# Patient Record
Sex: Male | Born: 1990 | Race: White | Hispanic: No | Marital: Single | Smoking: Never smoker
Health system: Southern US, Community
[De-identification: ages and names within clinical notes are randomized; demographics above are authoritative.]

## PROBLEM LIST (undated history)

## (undated) DIAGNOSIS — E7800 Pure hypercholesterolemia, unspecified: Secondary | ICD-10-CM

---
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
## 2020-05-22 DIAGNOSIS — L821 Other seborrheic keratosis: Secondary | ICD-10-CM | POA: Diagnosis not present

## 2020-05-22 DIAGNOSIS — L814 Other melanin hyperpigmentation: Secondary | ICD-10-CM | POA: Diagnosis not present

## 2020-05-22 DIAGNOSIS — L819 Disorder of pigmentation, unspecified: Secondary | ICD-10-CM | POA: Diagnosis not present

## 2020-05-22 DIAGNOSIS — D229 Melanocytic nevi, unspecified: Secondary | ICD-10-CM | POA: Diagnosis not present

## 2020-05-22 DIAGNOSIS — L570 Actinic keratosis: Secondary | ICD-10-CM | POA: Diagnosis not present

## 2020-07-17 ENCOUNTER — Ambulatory Visit (INDEPENDENT_AMBULATORY_CARE_PROVIDER_SITE_OTHER): Payer: BC Managed Care – PPO

## 2020-07-17 ENCOUNTER — Encounter (HOSPITAL_COMMUNITY): Payer: Self-pay

## 2020-07-17 ENCOUNTER — Ambulatory Visit (HOSPITAL_COMMUNITY)
Admission: RE | Admit: 2020-07-17 | Discharge: 2020-07-17 | Disposition: A | Discharge status: Home / Self Care | Payer: BC Managed Care – PPO | Source: Ambulatory Visit | Attending: Urgent Care | Admitting: Urgent Care

## 2020-07-17 ENCOUNTER — Other Ambulatory Visit: Payer: Self-pay

## 2020-07-17 VITALS — BP 144/87 | HR 60 | Temp 98.2°F | Resp 18

## 2020-07-17 DIAGNOSIS — R200 Anesthesia of skin: Secondary | ICD-10-CM

## 2020-07-17 DIAGNOSIS — R202 Paresthesia of skin: Secondary | ICD-10-CM | POA: Diagnosis not present

## 2020-07-17 DIAGNOSIS — M79601 Pain in right arm: Secondary | ICD-10-CM

## 2020-07-17 DIAGNOSIS — R222 Localized swelling, mass and lump, trunk: Secondary | ICD-10-CM | POA: Diagnosis not present

## 2020-07-17 HISTORY — DX: Pure hypercholesterolemia, unspecified: E78.00

## 2020-07-17 NOTE — ED Provider Notes (Signed)
[facility]   MRN: [id number] DOB: Jun 06, 1991  Subjective:   Jake Wolfe is a 29 y.o. male presenting for acute onset this morning right arm tingling and numbness from the mid bicep all the way down to his fingers.  Denies fall, trauma, neck pain, chest pain, shortness of breath.  Patient has a history of hyperlipidemia but is not on any medication for this.  He is new to the city and would like information to establish care with a new PCP.  No current facility-administered medications for this encounter. No current outpatient medications on file.   Allergies  Allergen Reactions  . Augmentin [Amoxicillin-Pot Clavulanate] Hives and Nausea Only  . Cefzil [Cefprozil] Hives and Nausea Only    Past Medical History:  Diagnosis Date  . Hypercholesteremia      History reviewed. No pertinent surgical history.  Family History  Problem Relation Age of Onset  . Rheum arthritis Mother   . Healthy Father     Social History   Tobacco Use  . Smoking status: Never Smoker  . Smokeless tobacco: Never Used  Vaping Use  . Vaping Use: Never used  Substance Use Topics  . Alcohol use: Yes    Alcohol/week: 2.0 - 4.0 standard drinks    Types: 1 - 2 Glasses of wine, 1 - 2 Cans of beer per week  . Drug use: Not Currently    ROS   Objective:   Vitals: BP (!) 144/87 (BP Location: Left Arm)   Pulse 60   Temp 98.2 F (36.8 C) (Oral)   Resp 18   SpO2 100%   Physical Exam Constitutional:      General: He is not in acute distress.    Appearance: Normal appearance. He is well-developed and normal weight. He is not ill-appearing, toxic-appearing or diaphoretic.  HENT:     Head: Normocephalic and atraumatic.     Right Ear: External ear normal.     Left Ear: External ear normal.     Nose: Nose normal.     Mouth/Throat:     Pharynx: Oropharynx is clear.  Eyes:     General: No scleral icterus.       Right eye: No discharge.        Left eye: No discharge.      Extraocular Movements: Extraocular movements intact.     Pupils: Pupils are equal, round, and reactive to light.  Cardiovascular:     Rate and Rhythm: Normal rate.  Pulmonary:     Effort: Pulmonary effort is normal.  Musculoskeletal:        General: No swelling, tenderness or deformity. Normal range of motion.     Right shoulder: No swelling, deformity, effusion, laceration, tenderness, bony tenderness or crepitus. Normal range of motion. Normal strength.     Right upper arm: No swelling, edema, deformity, lacerations, tenderness or bony tenderness.     Right forearm: No swelling, edema, deformity, lacerations, tenderness or bony tenderness.     Right wrist: No swelling, deformity, effusion, lacerations, tenderness, bony tenderness, snuff box tenderness or crepitus. Normal range of motion.     Right hand: No swelling, deformity, lacerations, tenderness or bony tenderness. Normal range of motion. Normal capillary refill.     Cervical back: Normal range of motion.     Comments: Strength 5/5 for upper and lower extremities.  Skin:    General: Skin is warm and dry.     Findings: No rash.  Neurological:  Mental Status: He is alert and oriented to person, place, and time.     Cranial Nerves: No cranial nerve deficit.     Motor: No weakness.     Coordination: Coordination normal.     Gait: Gait normal.     Deep Tendon Reflexes: Reflexes normal.     Comments: Sensation intact bilaterally for upper extremities including sharp and soft sensations.  Negative Romberg and pronator drift.  Psychiatric:        Mood and Affect: Mood normal.        Behavior: Behavior normal.        Thought Content: Thought content normal.        Judgment: Judgment normal.    DG Cervical Spine Complete  Result Date: 07/17/2020 CLINICAL DATA:  29 year old male with right arm numbness. EXAM: CERVICAL SPINE - COMPLETE 4+ VIEW COMPARISON:  None. FINDINGS: There is no acute fracture or subluxation of the cervical  spine. There is mild reversal of normal cervical lordosis which may be positional or due to muscle spasm. The vertebral body heights and disc spaces are maintained. The visualized posterior elements and odontoid appear intact. There is anatomic alignment of the lateral masses of C1 and C2. The soft tissues are unremarkable. IMPRESSION: No acute/traumatic cervical spine pathology. Electronically Signed   By: Elgie Collard M.D.   On: 07/17/2020 17:08    Assessment and Plan :   PDMP not reviewed this encounter.  1. Numbness and tingling of right arm     Had extensive discussion with patient about etiologies for his symptoms.  He does not have any active symptoms.  Counseled that he may have slept wrong.  Offered gabapentin but patient declined and ultimately I'm in agreement.  Recommended establishing care with a new PCP, information provided to them.  Will hold off on any blood work for now. Counseled patient on potential for adverse effects with medications prescribed/recommended today, ER and return-to-clinic precautions discussed, patient verbalized understanding.    Wallis Bamberg, [place] 07/17/20 1713

## 2020-07-17 NOTE — ED Triage Notes (Signed)
Pt c/o right arm pain shooting from elbow from wrist x 1 month. Pt state this usually happens at different times of the day. Pt states this morning it felt like his arm was asleep.

## 2020-08-28 ENCOUNTER — Other Ambulatory Visit: Payer: Self-pay

## 2020-08-28 ENCOUNTER — Ambulatory Visit (INDEPENDENT_AMBULATORY_CARE_PROVIDER_SITE_OTHER): Payer: BC Managed Care – PPO | Admitting: Internal Medicine

## 2020-08-28 ENCOUNTER — Encounter: Payer: Self-pay | Admitting: Internal Medicine

## 2020-08-28 VITALS — BP 140/94 | HR 97 | Temp 98.1°F | Ht 72.0 in | Wt 220.7 lb

## 2020-08-28 DIAGNOSIS — Z8639 Personal history of other endocrine, nutritional and metabolic disease: Secondary | ICD-10-CM | POA: Diagnosis not present

## 2020-08-28 DIAGNOSIS — M5412 Radiculopathy, cervical region: Secondary | ICD-10-CM | POA: Insufficient documentation

## 2020-08-28 NOTE — Progress Notes (Signed)
CC: Cervical radiculopathy and history of high cholesterol   HPI:[name] is a 30 y.o. male who presents for evaluation of Cervical radiculopathy and history of high cholesterol. Please see individual problem based A/P for details.  Past Medical History:  Diagnosis Date  . Hypercholesteremia    Past Surgical History:  Procedure Laterality Date  . TONSILECTOMY/ADENOIDECTOMY WITH MYRINGOTOMY  2005   Allergies  Allergen Reactions  . Augmentin [Amoxicillin-Pot Clavulanate] Hives and Nausea Only  . Cefzil [Cefprozil] Hives and Nausea Only   Family History  Problem Relation Age of Onset  . Rheum arthritis Mother   . Healthy Father   . Crohn's disease Sister   . Rheum arthritis Maternal Grandmother   . Rheum arthritis Maternal Grandfather     Social History   Tobacco Use  . Smoking status: Never Smoker  . Smokeless tobacco: Never Used  Vaping Use  . Vaping Use: Never used  Substance Use Topics  . Alcohol use: Yes    Alcohol/week: 2.0 - 4.0 standard drinks    Types: 1 - 2 Glasses of wine, 1 - 2 Cans of beer per week  . Drug use: Not Currently    Depression, PHQ-9: Based on the patients  Flowsheet Row Office Visit from 08/28/2020 in [facility]  PHQ-9 Total Score 1     score we have does not suggest depression.  Review of Systems:   Review of Systems  Constitutional: Negative for chills, fever and weight loss.  HENT: Negative for ear pain, sinus pain and tinnitus.   Eyes: Negative for blurred vision and double vision.  Respiratory: Negative for cough and wheezing.   Cardiovascular: Negative for chest pain and leg swelling.  Gastrointestinal: Negative for constipation and diarrhea.  Genitourinary: Negative for dysuria and urgency.  Musculoskeletal: Positive for myalgias.  Skin: Positive for rash. Negative for itching.  Neurological: Negative for dizziness, weakness and headaches.  Endo/Heme/Allergies: Negative for polydipsia. Does  not bruise/bleed easily.  Psychiatric/Behavioral: Negative for depression and substance abuse. The patient is not nervous/anxious.      Physical Exam: Vitals:   08/28/20 1446  BP: (!) 140/94  Pulse: 97  Temp: 98.1 F (36.7 C)  TempSrc: Oral  SpO2: 98%  Weight: 220 lb 11.2 oz (100.1 kg)  Height: 6' (1.829 m)   General: NAD, nl appearance HE: Normocephalic, atraumatic , EOMI, Conjunctivae normal ENT: No congestion, no rhinorrhea, no exudate or erythema  Cardiovascular: Normal rate, regular rhythm.  No murmurs, rubs, or gallops Pulmonary : Effort normal, breath sounds normal. No wheezes, rales, or rhonchi Abdominal: soft, nontender,  bowel sounds present Musculoskeletal: no swelling , deformity, injury ,or tenderness in extremities, Skin: Warm, dry , no bruising, erythema, or rash Psychiatric/Behavioral:  normal mood, normal behavior  Mental Status: Patient is awake, alert, oriented x3 No signs of aphasia or neglect Cranial Nerves: II: Pupils equal, round, and reactive to light.   III,IV, VI: EOMI without ptosis or diploplia.  V: Facial sensation is symmetric to light touch  VII: Facial movement is symmetric.  VIII: hearing is intact to voice X: Uvula elevates symmetrically XI: Shoulder shrug is symmetric. XII: tongue is midline without atrophy or fasciculations.  Motor: 5/5 bilateral UE, 5/5 bilateral lower extremitiy  Sensory: Sensation is grossly intact  bilateral UEs & LEs Deep Tendon Reflexes: 2+ triceps, biceps, brachioradialis, patellar and achilles Cerebellar: Finger-Nose and Heel-Shin intact bilalat   Assessment & Plan:   See Encounters Tab for problem based charting.  Patient discussed  with Dr. Philipp Ovens

## 2020-08-28 NOTE — Assessment & Plan Note (Addendum)
Patient reports he received Laural Benes [employer] Laural Benes vaccine March 2021. The days after receiving the vaccine he felt some malaise, fatigue, and radiculopathy in both arms.  The symptoms gradually improved, but since then he has periodically had radiculopathy.  He has had some unrelated muscle pain in his calves as well, but this symptom he has experience only a few times.  The radiculopathy can occur in left or his right arm.  It most frequently occurs in his right arm.  He feels a sharp shooting pain, which lasts about 10 minutes.  He frequently has been woken up from his sleep and it has occurred more often in the last month.  He estimates being woken up 4 out of the 7 nights per week.  When he wakes up he denies a feeling of numbness or sleeping on his arms.  The symptoms do occur during the day and at random times.  He cannot pinpoint a certain event which brings on the radiculopathy.  He usually moves his arm and stretches it, but does not feel like this helps with the pain.   His medical history is limited to history of high cholesterol.  On review of his family history his mother has rheuamtoid arthritis and his maternal grandparents both do as well. He reports being tested (only because of family history)  in the past with positive ANA and further workup negative for RA.  He was seen at urgent care recently and his cervical x-ray was normal and makes compression of cervical spine less likely.  Given he is experiencing pain in both arms and suggest a cervical radiculopathy or possible systemic cause. Given patient is having a lot of symptoms at night is suggestive of brachial plexopathy. He denies any recent trauma, and leaning toward nontraumatic causes of brachial plexopathy. This would not be a classic presentation of neuralgic amyotrophy given lack of pain and normal neurological exam, but possible. Patients symptoms due seem to be progressing. Further evaluation with a MRI of his cervical spine is warranted  and plan to obtain general lab work today to screen for electrolyte abnormalities, hyperglycemia, kidney function, and hepatic function panel  . In addition will check TSH and B12. If this workup is unrevealing will consider referral to neurology for consideration of EMG   Plan: - CMP14 + Anion Gap - CBC with Diff - TSH - Vitamin B12 - MR CERVICAL SPINE W WO CONTRAST; Future

## 2020-08-28 NOTE — Assessment & Plan Note (Signed)
Patient reports a history of high cholesterol.  His previous medical records are in another system and he has sent for them.  We will check his lipid panel today  Assessment: History of hyperlipidemia Plan: - Lipid Profile

## 2020-08-28 NOTE — Patient Instructions (Signed)
Thank you, Mr.Gurpreet Cartmell for allowing [place] to provide your care today. Today we discussed cervical radiculopathy. .    I have ordered the following labs for you:   Lab Orders     CMP14 + Anion Gap     CBC with Diff     TSH     Vitamin B12     Lipid Profile   Tests ordered today:  MRI Cervical Spine   Referrals ordered today:   Referral Orders  No referral(s) requested today     I have ordered the following medication/changed the following medications:   Stop the following medications: There are no discontinued medications.   Start the following medications: No orders of the defined types were placed in this encounter.    Follow up: 2 months or if symptoms worsen.     Remember: I will follow up with results and we will go from there.   Should you have any questions or concerns please call the internal medicine clinic at [phone].      Thurmon Fair, M.D. [facility]

## 2020-08-29 ENCOUNTER — Telehealth: Payer: Self-pay | Admitting: Internal Medicine

## 2020-08-29 ENCOUNTER — Telehealth: Payer: Self-pay

## 2020-08-29 DIAGNOSIS — R748 Abnormal levels of other serum enzymes: Secondary | ICD-10-CM

## 2020-08-29 DIAGNOSIS — E781 Pure hyperglyceridemia: Secondary | ICD-10-CM

## 2020-08-29 LAB — CBC WITH DIFFERENTIAL/PLATELET
Basophils Absolute: 0 10*3/uL (ref 0.0–0.2)
Basos: 0 %
EOS (ABSOLUTE): 0.1 10*3/uL (ref 0.0–0.4)
Eos: 2 %
Hematocrit: 48.3 % (ref 37.5–51.0)
Hemoglobin: 16.8 g/dL (ref 13.0–17.7)
Immature Grans (Abs): 0 10*3/uL (ref 0.0–0.1)
Immature Granulocytes: 0 %
Lymphocytes Absolute: 2.4 10*3/uL (ref 0.7–3.1)
Lymphs: 36 %
MCH: 30.5 pg (ref 26.6–33.0)
MCHC: 34.8 g/dL (ref 31.5–35.7)
MCV: 88 fL (ref 79–97)
Monocytes Absolute: 0.6 10*3/uL (ref 0.1–0.9)
Monocytes: 9 %
Neutrophils Absolute: 3.6 10*3/uL (ref 1.4–7.0)
Neutrophils: 53 %
Platelets: 270 10*3/uL (ref 150–450)
RBC: 5.51 x10E6/uL (ref 4.14–5.80)
RDW: 13.5 % (ref 11.6–15.4)
WBC: 6.8 10*3/uL (ref 3.4–10.8)

## 2020-08-29 LAB — CMP14 + ANION GAP
ALT: 97 IU/L — ABNORMAL HIGH (ref 0–44)
AST: 72 IU/L — ABNORMAL HIGH (ref 0–40)
Albumin/Globulin Ratio: 1.7 (ref 1.2–2.2)
Albumin: 4.7 g/dL (ref 4.1–5.2)
Alkaline Phosphatase: 76 IU/L (ref 44–121)
Anion Gap: 16 mmol/L (ref 10.0–18.0)
BUN/Creatinine Ratio: 11 (ref 9–20)
BUN: 10 mg/dL (ref 6–20)
Bilirubin Total: 0.4 mg/dL (ref 0.0–1.2)
CO2: 24 mmol/L (ref 20–29)
Calcium: 9.7 mg/dL (ref 8.7–10.2)
Chloride: 100 mmol/L (ref 96–106)
Creatinine, Ser: 0.92 mg/dL (ref 0.76–1.27)
GFR calc Af Amer: 131 mL/min/{1.73_m2} (ref >59)
GFR calc non Af Amer: 114 mL/min/{1.73_m2} (ref >59)
Globulin, Total: 2.7 g/dL (ref 1.5–4.5)
Glucose: 81 mg/dL (ref 65–99)
Potassium: 4.2 mmol/L (ref 3.5–5.2)
Sodium: 140 mmol/L (ref 134–144)
Total Protein: 7.4 g/dL (ref 6.0–8.5)

## 2020-08-29 LAB — TSH: TSH: 2.56 u[IU]/mL (ref 0.450–4.500)

## 2020-08-29 LAB — LIPID PANEL
Chol/HDL Ratio: 6.1 ratio — ABNORMAL HIGH (ref 0.0–5.0)
Cholesterol, Total: 242 mg/dL — ABNORMAL HIGH (ref 100–199)
HDL: 40 mg/dL (ref >39)
LDL Chol Calc (NIH): 82 mg/dL (ref 0–99)
Triglycerides: 746 mg/dL (ref 0–149)
VLDL Cholesterol Cal: 120 mg/dL — ABNORMAL HIGH (ref 5–40)

## 2020-08-29 LAB — VITAMIN B12: Vitamin B-12: 450 pg/mL (ref 232–1245)

## 2020-08-29 NOTE — Addendum Note (Signed)
Addended by: Gardenia Phlegm on: 08/29/2020 04:46 PM   Modules accepted: Orders

## 2020-08-29 NOTE — Telephone Encounter (Addendum)
Called and discussed recent lab results with patient. Liver enzymes elevated and triglycerides. He says his sister recently had mononucleosis and he was around her during the holidays. Her liver enzymes were also elevated and he wondered if this could be the cause. He denies any symptoms of infection and no signs on exam yesterday. His CBC did not show a lymphocytosis. Plan to repeat CMP before starting further workup for elevated liver enzymes, which include viral etiologies like EBV, autoimmune hepatitis, Wilson's disease, hemachromatosis, and NAFLD. We will also obtain fasting triglycerides.It is possible the elevated liver enzymes could be related to his radiculopathy.Will obtain MRI to evaluate for transverse myelitis and abnormalities which xray would not be sensitive for.

## 2020-08-29 NOTE — Progress Notes (Signed)
Patient called.  Patient aware.  

## 2020-08-29 NOTE — Addendum Note (Signed)
Addended by: Gardenia Phlegm on: 08/29/2020 03:40 PM   Modules accepted: Orders

## 2020-08-29 NOTE — Telephone Encounter (Signed)
Requesting lab results, please call pt back.  

## 2020-08-30 ENCOUNTER — Other Ambulatory Visit (INDEPENDENT_AMBULATORY_CARE_PROVIDER_SITE_OTHER): Payer: BC Managed Care – PPO

## 2020-08-30 DIAGNOSIS — R748 Abnormal levels of other serum enzymes: Secondary | ICD-10-CM | POA: Diagnosis not present

## 2020-08-30 DIAGNOSIS — E781 Pure hyperglyceridemia: Secondary | ICD-10-CM

## 2020-08-30 NOTE — Telephone Encounter (Signed)
Looks like Dr. Barbaraann Faster already spoke with him yesterday.

## 2020-08-31 LAB — CMP14 + ANION GAP
ALT: 76 IU/L — ABNORMAL HIGH (ref 0–44)
AST: 52 IU/L — ABNORMAL HIGH (ref 0–40)
Albumin/Globulin Ratio: 1.8 (ref 1.2–2.2)
Albumin: 4.7 g/dL (ref 4.1–5.2)
Alkaline Phosphatase: 77 IU/L (ref 44–121)
Anion Gap: 16 mmol/L (ref 10.0–18.0)
BUN/Creatinine Ratio: 14 (ref 9–20)
BUN: 14 mg/dL (ref 6–20)
Bilirubin Total: 0.5 mg/dL (ref 0.0–1.2)
CO2: 24 mmol/L (ref 20–29)
Calcium: 9.1 mg/dL (ref 8.7–10.2)
Chloride: 100 mmol/L (ref 96–106)
Creatinine, Ser: 0.97 mg/dL (ref 0.76–1.27)
GFR calc Af Amer: 123 mL/min/{1.73_m2} (ref >59)
GFR calc non Af Amer: 107 mL/min/{1.73_m2} (ref >59)
Globulin, Total: 2.6 g/dL (ref 1.5–4.5)
Glucose: 89 mg/dL (ref 65–99)
Potassium: 4.6 mmol/L (ref 3.5–5.2)
Sodium: 140 mmol/L (ref 134–144)
Total Protein: 7.3 g/dL (ref 6.0–8.5)

## 2020-08-31 LAB — TRIGLYCERIDES: Triglycerides: 467 mg/dL — ABNORMAL HIGH (ref 0–149)

## 2020-09-03 ENCOUNTER — Telehealth: Payer: Self-pay | Admitting: Internal Medicine

## 2020-09-03 ENCOUNTER — Encounter: Payer: Self-pay | Admitting: Internal Medicine

## 2020-09-03 DIAGNOSIS — R748 Abnormal levels of other serum enzymes: Secondary | ICD-10-CM

## 2020-09-03 NOTE — Telephone Encounter (Signed)
Patient has elevated transaminases suggesting hepatocellular injury. Will complete initial workup for viral causes, hemachromatosis, and NAFLD. Checking PT/INR for liver function.   Elevated liver enzymes - Epstein-Barr Virus VCA Antibody Panel - Hepatitis B Surface Antibody - Hepatitis B Surface Antigen - Hepatitis C antibody - Hepatitis B core Ab, Total - Hepatitis B e antibody,HBeAb (93570) - Hepatitis B Virus DNA Quant - Iron, TIBC and Ferritin Panel - Hemoglobin A1c - US Abdomen Limited RUQ (LIVER/GB); Future - Protime-INR

## 2020-09-04 ENCOUNTER — Other Ambulatory Visit (INDEPENDENT_AMBULATORY_CARE_PROVIDER_SITE_OTHER): Payer: BC Managed Care – PPO

## 2020-09-04 DIAGNOSIS — R748 Abnormal levels of other serum enzymes: Secondary | ICD-10-CM

## 2020-09-04 LAB — PROTIME-INR
INR: 0.9 (ref 0.8–1.2)
Prothrombin Time: 12.1 seconds (ref 11.4–15.2)

## 2020-09-04 NOTE — Addendum Note (Signed)
Addended by: Bufford Spikes on: 09/04/2020 11:30 AM   Modules accepted: Orders

## 2020-09-05 ENCOUNTER — Encounter: Payer: Self-pay | Admitting: Internal Medicine

## 2020-09-05 NOTE — Progress Notes (Signed)
Internal Medicine Clinic Attending  Case discussed with Dr. Steen  At the time of the visit.  We reviewed the resident's history and exam and pertinent patient test results.  I agree with the assessment, diagnosis, and plan of care documented in the resident's note.  

## 2020-09-06 ENCOUNTER — Encounter: Payer: Self-pay | Admitting: Internal Medicine

## 2020-09-06 ENCOUNTER — Telehealth: Payer: Self-pay | Admitting: Internal Medicine

## 2020-09-06 LAB — EPSTEIN-BARR VIRUS (EBV) ANTIBODY PROFILE
EBV NA IgG: 40.6 U/mL — ABNORMAL HIGH (ref 0.0–17.9)
EBV VCA IgG: 292 U/mL — ABNORMAL HIGH (ref 0.0–17.9)
EBV VCA IgM: <36 U/mL (ref 0.0–35.9)

## 2020-09-06 LAB — IRON,TIBC AND FERRITIN PANEL
Ferritin: 120 ng/mL (ref 30–400)
Iron Saturation: 30 % (ref 15–55)
Iron: 109 ug/dL (ref 38–169)
Total Iron Binding Capacity: 369 ug/dL (ref 250–450)
UIBC: 260 ug/dL (ref 111–343)

## 2020-09-06 LAB — HEMOGLOBIN A1C
Est. average glucose Bld gHb Est-mCnc: 108 mg/dL
Hgb A1c MFr Bld: 5.4 % (ref 4.8–5.6)

## 2020-09-06 LAB — HEPATITIS B DNA, ULTRAQUANTITATIVE, PCR: HBV DNA SERPL PCR-ACNC: NOT DETECTED IU/mL

## 2020-09-06 LAB — HEPATITIS C ANTIBODY: Hep C Virus Ab: <0.1 s/co ratio (ref 0.0–0.9)

## 2020-09-06 LAB — HEPATITIS B CORE ANTIBODY, TOTAL: Hep B Core Total Ab: NEGATIVE

## 2020-09-06 LAB — HEPATITIS B SURFACE ANTIGEN: Hepatitis B Surface Ag: NEGATIVE

## 2020-09-06 LAB — HEPATITIS B E ANTIBODY: Hep B E Ab: NEGATIVE

## 2020-09-06 LAB — HEPATITIS B SURFACE ANTIBODY,QUALITATIVE: Hep B Surface Ab, Qual: REACTIVE

## 2020-09-06 NOTE — Telephone Encounter (Signed)
Called and discussed recent lab work with patient. He was negative for Hep C, vaccinated and negative for Hep B virus,and Iron studies normal/ do not suggest hemochromatosis. 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  He continues to have radiculopathy and we are waiting on his MRI prior authorization. The MRI will help better evaluate for possible transverse myelitis from EBV and other neurological syndromes. He is schedule 1/17 for RUQ [place] to complete evaluation of liver. No further workup ordered at this time.

## 2020-09-07 ENCOUNTER — Encounter: Payer: BC Managed Care – PPO | Admitting: Internal Medicine

## 2020-09-10 ENCOUNTER — Ambulatory Visit (HOSPITAL_COMMUNITY)
Admission: RE | Admit: 2020-09-10 | Discharge: 2020-09-10 | Disposition: A | Discharge status: Home / Self Care | Payer: BC Managed Care – PPO | Source: Ambulatory Visit | Attending: Internal Medicine | Admitting: Internal Medicine

## 2020-09-10 ENCOUNTER — Other Ambulatory Visit: Payer: Self-pay

## 2020-09-10 DIAGNOSIS — R945 Abnormal results of liver function studies: Secondary | ICD-10-CM | POA: Diagnosis not present

## 2020-09-10 DIAGNOSIS — R748 Abnormal levels of other serum enzymes: Secondary | ICD-10-CM

## 2020-09-11 ENCOUNTER — Encounter: Payer: Self-pay | Admitting: Internal Medicine

## 2020-09-12 ENCOUNTER — Telehealth: Payer: Self-pay | Admitting: Internal Medicine

## 2020-09-12 NOTE — Telephone Encounter (Signed)
Called patient, no further questions regarding ultrasound of liver. He is still waiting to be schedule for MRI. I will message our clinic staff for help finding out the status. His symptoms are persistent. Not improved and have not worsened.

## 2020-09-12 NOTE — Progress Notes (Signed)
Patient notified of results via mychart

## 2020-09-16 DIAGNOSIS — Z20822 Contact with and (suspected) exposure to covid-19: Secondary | ICD-10-CM | POA: Diagnosis not present

## 2020-09-21 ENCOUNTER — Encounter: Payer: BC Managed Care – PPO | Admitting: Internal Medicine

## 2020-09-25 ENCOUNTER — Telehealth: Payer: Self-pay | Admitting: Internal Medicine

## 2020-09-25 NOTE — Telephone Encounter (Signed)
Called patient to follow up on status of MRI. He is scheduled on 2/8 for the MRI at [facility]. He informed me he tested positive for Covid-19 through routine screening done by his employer. He has had only mild symptoms and will be out of his quarantine period before 2/8. He will insure the MRI results are sent to our office for me to follow up.

## 2020-10-02 ENCOUNTER — Telehealth: Payer: Self-pay | Admitting: Internal Medicine

## 2020-10-02 ENCOUNTER — Encounter: Payer: Self-pay | Admitting: Internal Medicine

## 2020-10-02 DIAGNOSIS — M5412 Radiculopathy, cervical region: Secondary | ICD-10-CM | POA: Diagnosis not present

## 2020-10-02 NOTE — Telephone Encounter (Signed)
Called patient. MRI done at [facility] and results should be available tomorrow for me to review. Plan to follow up with patient tomorrow afternoon.

## 2020-10-03 ENCOUNTER — Telehealth: Payer: Self-pay | Admitting: Internal Medicine

## 2020-10-03 NOTE — Telephone Encounter (Signed)
Called patient and shared MRI cervical spine normal.  We reviewed his symptoms from the last month.  His symptoms for the first week of the month were similar to when he saw me last.  The next 2 weeks the symptoms had gotten better and almost subsided completely. This last week the symptoms came back and have been more frequent.  He is experiencing periodic left arm and right arm numbness.  He experiences shooting pain starting in his right shoulder which travels down his right arm.  He has more frequent shooting pain in his right arm than his left. We discussed scheduling him for an in person evaluation in the next week for evaluation and repeat CMP. It is likely he will need referral to neurology for EMG and nerve conduction studies at this time.

## 2020-10-10 ENCOUNTER — Ambulatory Visit (INDEPENDENT_AMBULATORY_CARE_PROVIDER_SITE_OTHER): Payer: BC Managed Care – PPO | Admitting: Internal Medicine

## 2020-10-10 ENCOUNTER — Encounter: Payer: Self-pay | Admitting: Internal Medicine

## 2020-10-10 DIAGNOSIS — K760 Fatty (change of) liver, not elsewhere classified: Secondary | ICD-10-CM

## 2020-10-10 DIAGNOSIS — M5412 Radiculopathy, cervical region: Secondary | ICD-10-CM | POA: Diagnosis not present

## 2020-10-10 NOTE — Assessment & Plan Note (Signed)
As mentioned in telephone notes MRI of cervical spine was normal.  Patient has had more symptoms in his legs than his last visit. He continues to get random pain described as pins and needle in his forearms and calves, but frequency has improved.  This is not impacting his daily schedule and work-up has been unrevealing.  We discussed referral to neurology, but at this time made a shared decision to watch for continued improvement. -No further work-up at this time and patient will schedule appointment if symptoms do not completely resolve

## 2020-10-10 NOTE — Progress Notes (Signed)
   CC: Cervical radiculpathy and steatosis of liver  HPI:Jake Wolfe is a 30 y.o. male who presents for evaluation of cervical radiculopathy and steatosis of liver. Please see individual problem based A/P for details.   Past Medical History:  Diagnosis Date  . Hypercholesteremia    Review of Systems:   Review of Systems  Constitutional: Negative for chills and fever.  Neurological: Positive for tingling. Negative for dizziness and weakness.     Physical Exam: Vitals:   10/10/20 1547  BP: 137/78  Pulse: 82  Temp: 98.2 F (36.8 C)  TempSrc: Oral  SpO2: 98%  Weight: 216 lb 14.4 oz (98.4 kg)  Height: 6' (1.829 m)     General: NAD, well kept HEENT: Normocephalic, atraumatic , Conjunctiva nl  Cardiovascular: Normal rate, regular rhythm.  No murmurs, rubs, or gallops Pulmonary : Equal breath sounds, No wheezes, rales, or rhonchi Abdominal: soft, nontender,  bowel sounds present Mental Status: Patient is awake, alert, oriented x3  No signs of aphasia or neglect Cranial Nerves: II: Pupils equal, round, and reactive to light.   III,IV, VI: EOMI without ptosis or diploplia.  V: Facial sensation is symmetric to light touch  VII: Facial movement is symmetric.  VIII: hearing is intact to voice X: Uvula elevates symmetrically XI: Shoulder shrug is symmetric. XII: tongue is midline without atrophy or fasciculations.  Motor: Equal effort thorughout, at Least 5/5 bilateral UE, 5/5 bilateral lower extremitiy  Sensory: Sensation is grossly intact to light touch bilateral UEs & LEs Deep Tendon Reflexes: Patellar, biceps, triceps, brachioradialis 2+ Plantars: Toes are downgoing Cerebellar: Finger-Nose and Heel-Shin are intact bilalat    Assessment & Plan:   See Encounters Tab for problem based charting.  Patient discussed with Dr. Oswaldo Done

## 2020-10-10 NOTE — Assessment & Plan Note (Signed)
Mild elevation in liver enzymes and fasting triglycerides approximately 500.  Right upper quadrant ultrasound showed steatosis.  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  Currently drinks 6-7 alcoholic drinks per week.  Has loosened his diet during Covid and working at home.  He is active and runs on his treadmill multiple times per week.  Plan: -Encouraged lifestyle changes, low-fat diet.  He is in recommended use of alcohol but given steatosis recommending cutting back. Continue to exercise regularly. Follow up lipid panel in 6 -12 months to monitor progress.

## 2020-10-10 NOTE — Patient Instructions (Signed)
Thank you, [name] for allowing [place] to provide your care today. Today we discussed pins and needle feeling in your arm and legs.  I wish we could have figured out specifically what was causing this pain. I am happy you are improving. Please follow up with [place] if needed.   I have ordered the following labs for you:  Lab Orders  No laboratory test(s) ordered today     Tests ordered today:    Referrals ordered today:   Referral Orders  No referral(s) requested today     I have ordered the following medication/changed the following medications:   Stop the following medications: There are no discontinued medications.   Start the following medications: No orders of the defined types were placed in this encounter.    Follow up: 6 months    Should you have any questions or concerns please call the internal medicine clinic at [phone].      Thurmon Fair, M.D. [facility]

## 2020-10-12 NOTE — Progress Notes (Signed)
Internal Medicine Clinic Attending  Case discussed with Dr. Steen  At the time of the visit.  We reviewed the resident's history and exam and pertinent patient test results.  I agree with the assessment, diagnosis, and plan of care documented in the resident's note.  

## 2020-10-18 ENCOUNTER — Encounter: Payer: Self-pay | Admitting: Internal Medicine

## 2020-10-19 ENCOUNTER — Encounter: Payer: Self-pay | Admitting: Internal Medicine

## 2020-10-22 NOTE — Telephone Encounter (Signed)
I looked over forms and attempted to complete per patients request. I do not  have enough information to complete forms at this time. I will ask for patient to be scheduled to complete forms. Please have patient bring a new set of forms, as he will be seeing one of my colleagues.

## 2020-10-25 ENCOUNTER — Ambulatory Visit (INDEPENDENT_AMBULATORY_CARE_PROVIDER_SITE_OTHER): Payer: BC Managed Care – PPO | Admitting: Internal Medicine

## 2020-10-25 ENCOUNTER — Other Ambulatory Visit: Payer: Self-pay

## 2020-10-25 DIAGNOSIS — M5412 Radiculopathy, cervical region: Secondary | ICD-10-CM

## 2020-10-25 NOTE — Progress Notes (Deleted)
   CC: ***  This is a telephone encounter between Althia Forts and Versie Starks on 10/25/2020 for ***. The visit was conducted with the patient located at {NAMES:[id number]::"home"} and Jaimie A Seawell at {NAMES:[id number]::"[facility]","Home"}. The patient's identity was confirmed using their DOB and current address. The {WHO:3044014::"patient","his/her legal guardian","***"} has consented to being evaluated through a telephone encounter and understands the associated risks (an examination cannot be done and the patient may need to come in for an appointment) / benefits (allows the patient to remain at home, decreasing exposure to coronavirus). I personally spent {Numbers; [phone]} minutes on medical discussion.   HPI:  Mr.Criag Tandy is a 30 y.o. with PMH as below.   Please see A&P for assessment of the patient's acute and chronic medical conditions.   Past Medical History:  Diagnosis Date  . Hypercholesteremia    Review of Systems:  ***    Assessment & Plan:   See Encounters Tab for problem based charting.  Patient {GC/GE:3044014::"discussed with","seen with"} Dr. {NAMES:[id number]::"Butcher","Granfortuna","E. Hoffman","Klima","Mullen","Narendra","Raines","Vincent"}

## 2020-10-25 NOTE — Assessment & Plan Note (Signed)
Continuing to have symptoms of numbness and tingling with some shooting pains in his lower extremities and upper extremities, continues to be worse in the right arm, symptoms present since he had covid right after receiving J&J vaccine one year ago. Symptoms are sporadic. No dizziness, changes in vision, weakness other than for about a half hour when he wakes up and his right arm is numb. 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 Cervical MRI in care everywhere done at [facility] without acute findings.   - referral to neurology placed  - filled out paperwork for reduced hours

## 2020-10-25 NOTE — Progress Notes (Signed)
   CC: neuropathy  This is a telephone encounter between Jake Wolfe and Jake Wolfe on 10/25/2020 for numbness and tingling. The visit was conducted with the patient located at home and Jake Wolfe at [facility]. The patient's identity was confirmed using their DOB and current address. The patient has consented to being evaluated through a telephone encounter and understands the associated risks (an examination cannot be done and the patient may need to come in for an appointment) / benefits (allows the patient to remain at home, decreasing exposure to coronavirus). I personally spent 18 minutes on medical discussion.   HPI:  [name] is a 30 y.o. with PMH as below.   Please see A&P for assessment of the patient's acute and chronic medical conditions.    Past Medical History:  Diagnosis Date  . Hypercholesteremia    Review of Systems:   Review of Systems  Constitutional: Negative for chills, fever, malaise/fatigue and weight loss.  Eyes: Negative for blurred vision and double vision.  Respiratory: Negative for cough and shortness of breath.   Gastrointestinal: Negative for abdominal pain and nausea.  Musculoskeletal: Positive for myalgias. Negative for back pain, falls, joint pain and neck pain.  Neurological: Positive for tingling and sensory change. Negative for dizziness, tremors, focal weakness, weakness and headaches.    Assessment & Plan:   See Encounters Tab for problem based charting.  Patient discussed with Dr. Heide Spark

## 2020-10-26 NOTE — Progress Notes (Signed)
Internal Medicine Clinic Attending  Case discussed with Dr. Seawell  At the time of the visit.  We reviewed the resident's history and exam and pertinent patient test results.  I agree with the assessment, diagnosis, and plan of care documented in the resident's note.  

## 2020-11-15 ENCOUNTER — Encounter: Payer: Self-pay | Admitting: Internal Medicine

## 2020-11-15 NOTE — Addendum Note (Signed)
Addended by: Guinevere Scarlet A on: 11/15/2020 05:13 PM   Modules accepted: Orders

## 2020-12-12 ENCOUNTER — Encounter: Payer: BC Managed Care – PPO | Admitting: Internal Medicine

## 2021-02-01 ENCOUNTER — Ambulatory Visit: Payer: BC Managed Care – PPO | Admitting: Diagnostic Neuroimaging

## 2021-02-06 ENCOUNTER — Ambulatory Visit (HOSPITAL_COMMUNITY): Payer: Self-pay

## 2021-02-07 ENCOUNTER — Encounter: Payer: BC Managed Care – PPO | Admitting: Internal Medicine

## 2021-02-08 ENCOUNTER — Ambulatory Visit (HOSPITAL_COMMUNITY)
Admission: RE | Admit: 2021-02-08 | Discharge: 2021-02-08 | Disposition: A | Discharge status: Home / Self Care | Payer: BC Managed Care – PPO | Source: Ambulatory Visit | Attending: Medical Oncology | Admitting: Medical Oncology

## 2021-02-08 ENCOUNTER — Encounter (HOSPITAL_COMMUNITY): Payer: Self-pay

## 2021-02-08 ENCOUNTER — Other Ambulatory Visit: Payer: Self-pay

## 2021-02-08 VITALS — BP 135/83 | HR 92 | Temp 98.5°F | Resp 16

## 2021-02-08 DIAGNOSIS — J029 Acute pharyngitis, unspecified: Secondary | ICD-10-CM

## 2021-02-08 LAB — POCT RAPID STREP A, ED / UC: Streptococcus, Group A Screen (Direct): NEGATIVE

## 2021-02-08 MED ORDER — OMEPRAZOLE 20 MG PO CPDR: 20.0000 mg | DELAYED_RELEASE_CAPSULE | Freq: Every day | ORAL | 0 refills | Status: DC

## 2021-02-08 NOTE — ED Provider Notes (Addendum)
[facility]    CSN: [id number] Arrival date & time: 02/08/21  1840      History   Chief Complaint Chief Complaint  Patient presents with   Sore Throat    APPT   Headache    HPI Damarrion Mimbs is a 30 y.o. male.   HPI  Sore Throat: Patient reports that for the past week he has had a sore and scratchy throat along with mild headaches off and on.  He reports no fever, vomiting, diarrhea, cough, nasal congestion, ear pain.  No significant GERD symptoms that he has had.  He has tried Tylenol for symptoms with out much relief.  No known sick contacts.  Of note he denies any new or troublesome foods as far as allergies, new medications or products.  He also denies being a smoker of any products.  Past Medical History:  Diagnosis Date   Hypercholesteremia     Patient Active Problem List   Diagnosis Date Noted   Steatosis of liver 10/10/2020   Cervical radiculopathy 08/28/2020   History of high cholesterol 08/28/2020    Past Surgical History:  Procedure Laterality Date   TONSILECTOMY/ADENOIDECTOMY WITH MYRINGOTOMY  2005       Home Medications    Prior to Admission medications   Not on File    Family History Family History  Problem Relation Age of Onset   Rheum arthritis Mother    Healthy Father    Crohn's disease Sister    Rheum arthritis Maternal Grandmother    Rheum arthritis Maternal Grandfather     Social History Social History   Tobacco Use   Smoking status: Never   Smokeless tobacco: Never  Vaping Use   Vaping Use: Never used  Substance Use Topics   Alcohol use: Yes    Alcohol/week: 2.0 - 4.0 standard drinks    Types: 1 - 2 Glasses of wine, 1 - 2 Cans of beer per week   Drug use: Not Currently     Allergies   Augmentin [amoxicillin-pot clavulanate] and Cefzil [cefprozil]   Review of Systems Review of Systems  As stated above in HPI Physical Exam Triage Vital Signs ED Triage Vitals  Enc Vitals Group     BP 02/08/21 1913  135/83     Pulse Rate 02/08/21 1913 92     Resp 02/08/21 1913 16     Temp 02/08/21 1913 98.5 F (36.9 C)     Temp Source 02/08/21 1913 Oral     SpO2 02/08/21 1913 97 %     Weight --      Height --      Head Circumference --      Peak Flow --      Pain Score 02/08/21 1910 3     Pain Loc --      Pain Edu? --      Excl. in GC? --    No data found.  Updated Vital Signs BP 135/83 (BP Location: Left Arm)   Pulse 92   Temp 98.5 F (36.9 C) (Oral)   Resp 16   SpO2 97%   Physical Exam Vitals and nursing note reviewed.  Constitutional:      Appearance: He is well-developed.  HENT:     Head: Normocephalic and atraumatic.     Right Ear: Tympanic membrane and ear canal normal. No tenderness. No middle ear effusion. Tympanic membrane is not erythematous.     Left Ear: Tympanic membrane and ear canal normal. No tenderness.  No middle ear effusion. Tympanic membrane is not erythematous.     Nose: No congestion or rhinorrhea.     Mouth/Throat:     Mouth: Mucous membranes are moist. No oral lesions.     Pharynx: Oropharynx is clear. Uvula midline. No pharyngeal swelling, oropharyngeal exudate, posterior oropharyngeal erythema or uvula swelling.     Tonsils: No tonsillar exudate or tonsillar abscesses.  Eyes:     Conjunctiva/sclera: Conjunctivae normal.     Pupils: Pupils are equal, round, and reactive to light.  Cardiovascular:     Rate and Rhythm: Normal rate and regular rhythm.     Heart sounds: Normal heart sounds.  Pulmonary:     Effort: Pulmonary effort is normal.     Breath sounds: Normal breath sounds.  Musculoskeletal:     Cervical back: Neck supple.  Lymphadenopathy:     Cervical: No cervical adenopathy.  Skin:    General: Skin is warm.     Findings: No rash.  Neurological:     Mental Status: He is alert.     UC Treatments / Results  Labs (all labs ordered are listed, but only abnormal results are displayed) Labs Reviewed - No data to  display  EKG   Radiology No results found.  Procedures Procedures (including critical care time)  Medications Ordered in UC Medications - No data to display  Initial Impression / Assessment and Plan / UC Course  I have reviewed the triage vital signs and the nursing notes.  Pertinent labs & imaging results that were available during my care of the patient were reviewed by me and considered in my medical decision making (see chart for details).     New.  Ensuring that his rapid strep is negative.  If this is negative I am going to trial him on omeprazole x14 days to see if this helps with symptoms.  If it does not and symptoms do not resolve within 14 days I am going to recommend an ear nose throat specialist for patient.  Discussed red flag signs and symptoms.  Hydration with water encouraged. Final Clinical Impressions(s) / UC Diagnoses   Final diagnoses:  None   Discharge Instructions   None    ED Prescriptions   None    PDMP not reviewed this encounter.   Rushie Chestnut, Cordelia Poche 02/08/21 1923    Rushie Chestnut, PA-C 02/08/21 1956

## 2021-02-08 NOTE — ED Triage Notes (Signed)
Pt presents with sore/ scratchy throat and headache that comes and goes xs 1 week. States tylenol has given no relief.

## 2021-02-11 LAB — CULTURE, GROUP A STREP (THRC)

## 2021-02-12 ENCOUNTER — Other Ambulatory Visit: Payer: Self-pay

## 2021-02-12 ENCOUNTER — Encounter: Payer: Self-pay | Admitting: Student

## 2021-02-12 ENCOUNTER — Ambulatory Visit (INDEPENDENT_AMBULATORY_CARE_PROVIDER_SITE_OTHER): Payer: BC Managed Care – PPO | Admitting: Student

## 2021-02-12 VITALS — BP 126/89 | HR 94 | Temp 98.6°F | Ht 72.0 in | Wt 223.4 lb

## 2021-02-12 DIAGNOSIS — R070 Pain in throat: Secondary | ICD-10-CM

## 2021-02-12 DIAGNOSIS — M542 Cervicalgia: Secondary | ICD-10-CM | POA: Diagnosis not present

## 2021-02-12 MED ORDER — NAPROXEN 500 MG PO TABS: 500.0000 mg | ORAL_TABLET | Freq: Two times a day (BID) | ORAL | 0 refills | Status: AC

## 2021-02-12 NOTE — Progress Notes (Signed)
   CC: Neck pain  HPI:  Jake Wolfe is a 30 y.o. with past medical history significant for cervical radiculopathy and hyperlipidemia who presents to clinic for evaluation of neck pain. Refer to problem list for charting of this encounter.  Past Medical History:  Diagnosis Date   Hypercholesteremia    Review of Systems:  Endorses neck pain with swallowing. Denies chest pain, shortness of breath, cough, fevers, chills, abdominal pain, nausea, vomiting.  Physical Exam:  Vitals:   02/12/21 1455  BP: 126/89  Pulse: 94  Temp: 98.6 F (37 C)  TempSrc: Oral  SpO2: 97%  Weight: 223 lb 6.4 oz (101.3 kg)  Height: 6' (1.829 m)   Physical Exam Constitutional:      General: He is not in acute distress.    Appearance: He is not ill-appearing.  HENT:     Head: Normocephalic and atraumatic.     Mouth/Throat:     Mouth: Mucous membranes are moist.     Pharynx: Oropharynx is clear. No oropharyngeal exudate or posterior oropharyngeal erythema.  Neck:     Comments: No thyromegaly or appreciable thyroid nodules Cardiovascular:     Rate and Rhythm: Normal rate and regular rhythm.  Pulmonary:     Effort: Pulmonary effort is normal.     Breath sounds: Normal breath sounds.  Abdominal:     General: Abdomen is flat. Bowel sounds are normal.     Palpations: Abdomen is soft.     Tenderness: There is no abdominal tenderness.  Musculoskeletal:     Cervical back: Normal range of motion and neck supple. No rigidity or tenderness.  Lymphadenopathy:     Cervical: No cervical adenopathy.    Assessment & Plan:   See Encounters Tab for problem based charting.  Patient discussed with Dr.  Mayford Knife

## 2021-02-12 NOTE — Patient Instructions (Signed)
Joe,  It was a pleasure meeting you in clinic today.  For your throat pain, this is likely something that will improve with time. I would suggest taking an anti-inflammatory medication called Naproxen to help reduce inflammation and tension in the neck. You may continue taking the omeprazole prescribed by urgent care as well. Stay hydrated. If any new symptoms start or your symptoms worsen over the next week, please contact our clinic for re-evaluation.  Sincerely, Dr. Jasmine December, MD

## 2021-02-12 NOTE — Assessment & Plan Note (Signed)
Patient reports that eight days ago he noticed pain in the anterior, lower aspect of his right neck when swallowing during his breakfast. He states that since onset, the symptoms have been mild and present with swallowing of water or food. The discomfort is not present with swallowing of saliva or without food/liquids. He denies associated symptoms of fevers, chills, nausea, vomiting, abdominal pain, cough, shortness of breath. He was seen in Urgent Care four days ago for this complaint and received strep antigen and culture which were negative. He receives weekly COVID-19 testing which has been negative. He has previously underwent MRI of the cervical spine with no significant abnormality of this region. On physical examination, patient is comfortable-appearing. Examination of the oropharynx reveals no erythema, exudates or swelling. Palpation of the thyroid reveals no enlargement, tenderness or appreciable nodules. No cervical lymphadenopathy on examination.   Patient's symptoms may be secondary to muscle strain of the anterior neck versus mild viral upper respiratory tract infection although he has no additional infectious or systemic symptoms. -Start naproxen 500mg  twice daily with meals for 7 days -May continue omeprazole 20mg  daily to complete course -If symptoms progress or fail to improve in the next week, contact clinic

## 2021-02-26 ENCOUNTER — Encounter: Payer: Self-pay | Admitting: *Deleted

## 2021-02-26 NOTE — Progress Notes (Signed)
Internal Medicine Clinic Attending  Case discussed with Dr. Johnson  At the time of the visit.  We reviewed the resident's history and exam and pertinent patient test results.  I agree with the assessment, diagnosis, and plan of care documented in the resident's note.  

## 2021-03-18 DIAGNOSIS — L738 Other specified follicular disorders: Secondary | ICD-10-CM | POA: Diagnosis not present

## 2021-03-18 DIAGNOSIS — L570 Actinic keratosis: Secondary | ICD-10-CM | POA: Diagnosis not present

## 2021-04-24 ENCOUNTER — Ambulatory Visit (INDEPENDENT_AMBULATORY_CARE_PROVIDER_SITE_OTHER): Payer: BC Managed Care – PPO | Admitting: Diagnostic Neuroimaging

## 2021-04-24 ENCOUNTER — Encounter: Payer: Self-pay | Admitting: Diagnostic Neuroimaging

## 2021-04-24 ENCOUNTER — Other Ambulatory Visit: Payer: Self-pay

## 2021-04-24 VITALS — BP 146/94 | HR 91 | Ht 72.0 in | Wt 226.4 lb

## 2021-04-24 DIAGNOSIS — R202 Paresthesia of skin: Secondary | ICD-10-CM

## 2021-04-24 NOTE — Progress Notes (Signed)
[facility]  PATIENT: Jake Wolfe DOB: [birth year]  REFERRING CLINICIAN: Earl Lagos, MD HISTORY FROM: patient REASON FOR VISIT: new consult   HISTORICAL  CHIEF COMPLAINT:  Chief Complaint  Patient presents with   New Patient (Initial Visit)    Rm 6 alone. Pt reports over the last 1.5 years he has struggled with tingling in his arm and legs. Reports in March of 2021 he had covid along with the covid vaccine and soon after his sx started.     HISTORY OF PRESENT ILLNESS:   30 year old male here for evaluation of intermittent migratory paresthesias.  March 2021 patient had COVID-vaccine and a few days later had COVID symptoms.  He tested positive for COVID.  Within a week his symptoms have resolved.  However he then started to have some intermittent shooting, numbness, burning, pins-and-needles sensations in his arms and legs.  Symptoms were intermittent lasting for few minutes at a time throughout the day.  Sometimes symptoms would last for a few days in a row and sometimes symptoms would go away for up to 1 month at a time.  No specific triggering or aggravating factors.  Symptoms could affect 1 extremity or all 4 extremities at the same time.  No problems with face, vision, speech or swallowing.  No neck pain or low back pain.  No weakness.  No change in diet, exercise, sleep or stress.   REVIEW OF SYSTEMS: Full 14 system review of systems performed and negative with exception of: as per HPI.  ALLERGIES: Allergies  Allergen Reactions   Augmentin [Amoxicillin-Pot Clavulanate] Hives and Nausea Only   Cefzil [Cefprozil] Hives and Nausea Only    HOME MEDICATIONS: Outpatient Medications Prior to Visit  Medication Sig Dispense Refill   Multiple Vitamin (MULTIVITAMIN) tablet Take 1 tablet by mouth daily.     omeprazole (PRILOSEC) 20 MG capsule Take 1 capsule (20 mg total) by mouth daily. 14 capsule 0   No facility-administered medications prior to  visit.    PAST MEDICAL HISTORY: Past Medical History:  Diagnosis Date   Hypercholesteremia     PAST SURGICAL HISTORY: Past Surgical History:  Procedure Laterality Date   TONSILECTOMY/ADENOIDECTOMY WITH MYRINGOTOMY  2005    FAMILY HISTORY: Family History  Problem Relation Age of Onset   Rheum arthritis Mother    Healthy Father    Crohn's disease Sister    Rheum arthritis Maternal Grandmother    Rheum arthritis Maternal Grandfather     SOCIAL HISTORY: Social History   Socioeconomic History   Marital status: Single    Spouse name: Not on file   Number of children: Not on file   Years of education: Not on file   Highest education level: Bachelor's degree (e.g., BA, AB, BS)  Occupational History   Not on file  Tobacco Use   Smoking status: Never   Smokeless tobacco: Never  Vaping Use   Vaping Use: Never used  Substance and Sexual Activity   Alcohol use: Yes    Alcohol/week: 2.0 - 4.0 standard drinks    Types: 1 - 2 Glasses of wine, 1 - 2 Cans of beer per week   Drug use: Not Currently   Sexual activity: Not Currently    Partners: Male, Male  Other Topics Concern   Not on file  Social History Narrative   Right handed   Caffeine- 3-4 cups per day   Lives alone   Social Determinants of Health   Financial Resource Strain: Not on file  Food Insecurity: Not on file  Transportation Needs: Not on file  Physical Activity: Not on file  Stress: Not on file  Social Connections: Not on file  Intimate Partner Violence: Not on file     PHYSICAL EXAM  GENERAL EXAM/CONSTITUTIONAL: Vitals:  Vitals:   04/24/21 0804  BP: (!) 146/94  Pulse: 91  Weight: 226 lb 6 oz (102.7 kg)  Height: 6' (1.829 m)   Body mass index is 30.7 kg/m. Wt Readings from Last 3 Encounters:  04/24/21 226 lb 6 oz (102.7 kg)  02/12/21 223 lb 6.4 oz (101.3 kg)  10/10/20 216 lb 14.4 oz (98.4 kg)   Patient is in no distress; well developed, nourished and groomed; neck is  supple  CARDIOVASCULAR: Examination of carotid arteries is normal; no carotid bruits Regular rate and rhythm, no murmurs Examination of peripheral vascular system by observation and palpation is normal  EYES: Ophthalmoscopic exam of optic discs and posterior segments is normal; no papilledema or hemorrhages No results found.  MUSCULOSKELETAL: Gait, strength, tone, movements noted in Neurologic exam below  NEUROLOGIC: MENTAL STATUS:  No flowsheet data found. awake, alert, oriented to person, place and time recent and remote memory intact normal attention and concentration language fluent, comprehension intact, naming intact fund of knowledge appropriate  CRANIAL NERVE:  2nd - no papilledema on fundoscopic exam 2nd, 3rd, 4th, 6th - pupils equal and reactive to light, visual fields full to confrontation, extraocular muscles intact, no nystagmus 5th - facial sensation symmetric 7th - facial strength symmetric 8th - hearing intact 9th - palate elevates symmetrically, uvula midline 11th - shoulder shrug symmetric 12th - tongue protrusion midline  MOTOR:  normal bulk and tone, full strength in the BUE, BLE  SENSORY:  normal and symmetric to light touch, pinprick, temperature, vibration  COORDINATION:  finger-nose-finger, fine finger movements normal  REFLEXES:  deep tendon reflexes 1+ and symmetric  GAIT/STATION:  narrow based gait     DIAGNOSTIC DATA (LABS, IMAGING, TESTING) - I reviewed patient records, labs, notes, testing and imaging myself where available.  Lab Results  Component Value Date   WBC 6.8 08/28/2020   HGB 16.8 08/28/2020   HCT 48.3 08/28/2020   MCV 88 08/28/2020   PLT 270 08/28/2020      Component Value Date/Time   NA 140 08/30/2020 0852   K 4.6 08/30/2020 0852   CL 100 08/30/2020 0852   CO2 24 08/30/2020 0852   GLUCOSE 89 08/30/2020 0852   BUN 14 08/30/2020 0852   CREATININE 0.97 08/30/2020 0852   CALCIUM 9.1 08/30/2020 0852   PROT  7.3 08/30/2020 0852   ALBUMIN 4.7 08/30/2020 0852   AST 52 (H) 08/30/2020 0852   ALT 76 (H) 08/30/2020 0852   ALKPHOS 77 08/30/2020 0852   BILITOT 0.5 08/30/2020 0852   GFRNONAA 107 08/30/2020 0852   GFRAA 123 08/30/2020 0852   Lab Results  Component Value Date   CHOL 242 (H) 08/28/2020   HDL 40 08/28/2020   LDLCALC 82 08/28/2020   TRIG 467 (H) 08/30/2020   CHOLHDL 6.1 (H) 08/28/2020   Lab Results  Component Value Date   HGBA1C 5.4 09/04/2020   Lab Results  Component Value Date   VITAMINB12 450 08/28/2020   Lab Results  Component Value Date   TSH 2.560 08/28/2020    10/02/20 MRI cervical spine - Normal MRI of the cervical spine.     ASSESSMENT AND PLAN  30 y.o. year old male here with intermittent migratory paresthesias of the  arms and legs since March 2021.  Today neurologic examination is normal.  Suspect benign paresthesias.  Recommend monitor for next 4 to 6 weeks.  Not improving then may consider additional work-up including MRI brain, EMG nerve conduction and other lab testing.   Dx:  1. Paresthesia     PLAN:  INTERMITTENT MIGRATORY PARESTHESIAS (numbness, burning, tingling; normal neurologic exam) - monitor for next 4-6 weeks; if not improving, then consider MRI brain and EMG/NCS  Return for pending if symptoms worsen or fail to improve.    Suanne Marker, MD 04/24/2021, 9:05 AM Certified in Neurology, Neurophysiology and Neuroimaging  [facility] [address] [place], [place] [address] [phone]

## 2021-04-24 NOTE — Patient Instructions (Signed)
-   monitor for next 4-6 weeks; if not improving, then consider MRI brain and EMG/NCS (nerve testing)

## 2021-08-22 ENCOUNTER — Telehealth: Payer: Self-pay | Admitting: *Deleted

## 2021-08-22 NOTE — Telephone Encounter (Signed)
My Chart Message to front office: Appointment Request From: Jake Wolfe   With Provider: Milus Banister, MD [facility]]   Preferred Date Range: Any date 08/22/2021 or later   Preferred Times: Thursday Afternoon, Friday Morning, Friday Afternoon   Reason for visit: Request an Appointment   Comments: Abdominal pains   I called pt about his abd pain- he stated it more of an achy pain for the last 1-2 weeks. Stated no other symptoms. Informed no appts today and the office will be closed until Jan 3rd (he understood). He stated if it gets worse, he will go to UC. But he wanted to schedule an appt for next Thursday - call transferred to front office - appt schedule with Dr Burnice Logan 08/29/21 @ 1515PM.

## 2021-08-23 ENCOUNTER — Ambulatory Visit
Admission: RE | Admit: 2021-08-23 | Discharge: 2021-08-23 | Disposition: A | Discharge status: Home / Self Care | Payer: BC Managed Care – PPO | Source: Ambulatory Visit | Attending: Emergency Medicine | Admitting: Emergency Medicine

## 2021-08-23 ENCOUNTER — Other Ambulatory Visit: Payer: Self-pay

## 2021-08-23 VITALS — BP 132/86 | HR 95 | Temp 97.9°F | Resp 18

## 2021-08-23 DIAGNOSIS — R1084 Generalized abdominal pain: Secondary | ICD-10-CM

## 2021-08-23 DIAGNOSIS — K5900 Constipation, unspecified: Secondary | ICD-10-CM

## 2021-08-23 MED ORDER — PSYLLIUM 400 MG PO CAPS: 1.0000 | ORAL_CAPSULE | Freq: Two times a day (BID) | ORAL | 0 refills | Status: AC

## 2021-08-23 NOTE — ED Provider Notes (Signed)
[facility]    CSN: [id number] Arrival date & time: 08/23/21  1116    HISTORY  No chief complaint on file.  HPI Jake Wolfe is a 30 y.o. male. Patient c/o generalized abd pain that has been going on for 4-5 days. Patient states while lying flat he feels a pulse in his abd. Pt denies N/V/D and he reports having an increase in thirst.   The history is provided by the patient.  Past Medical History:  Diagnosis Date   Hypercholesteremia    Patient Active Problem List   Diagnosis Date Noted   Acute neck pain 02/12/2021   Steatosis of liver 10/10/2020   Cervical radiculopathy 08/28/2020   History of high cholesterol 08/28/2020   Past Surgical History:  Procedure Laterality Date   TONSILECTOMY/ADENOIDECTOMY WITH MYRINGOTOMY  2005    Home Medications    Prior to Admission medications   Medication Sig Start Date End Date Taking? Authorizing Provider  Multiple Vitamin (MULTIVITAMIN) tablet Take 1 tablet by mouth daily.    [provider]    Family History Family History  Problem Relation Age of Onset   Rheum arthritis Mother    Healthy Father    Crohn's disease Sister    Rheum arthritis Maternal Grandmother    Rheum arthritis Maternal Grandfather    Social History Social History   Tobacco Use   Smoking status: Never   Smokeless tobacco: Never  Vaping Use   Vaping Use: Never used  Substance Use Topics   Alcohol use: Yes    Alcohol/week: 2.0 - 4.0 standard drinks    Types: 1 - 2 Glasses of wine, 1 - 2 Cans of beer per week   Drug use: Not Currently   Allergies   Augmentin [amoxicillin-pot clavulanate] and Cefzil [cefprozil]  Review of Systems Review of Systems Pertinent findings noted in history of present illness.   Physical Exam Triage Vital Signs ED Triage Vitals  Enc Vitals Group     BP 06/21/21 0827 (!) 147/82     Pulse Rate 06/21/21 0827 72     Resp 06/21/21 0827 18     Temp 06/21/21 0827 98.3 F (36.8 C)     Temp Source  06/21/21 0827 Oral     SpO2 06/21/21 0827 98 %     Weight --      Height --      Head Circumference --      Peak Flow --      Pain Score 06/21/21 0826 5     Pain Loc --      Pain Edu? --      Excl. in [place]? --   No data found.  Updated Vital Signs BP 132/86 (BP Location: Right Arm)    Pulse 95    Temp 97.9 F (36.6 C) (Oral)    Resp 18    SpO2 96%   Physical Exam Vitals and nursing note reviewed.  Constitutional:      General: He is not in acute distress.    Appearance: Normal appearance. He is not ill-appearing.  HENT:     Head: Normocephalic and atraumatic.     Salivary Glands: Right salivary gland is not diffusely enlarged or tender. Left salivary gland is not diffusely enlarged or tender.     Right Ear: Tympanic membrane, ear canal and external ear normal. No drainage. No middle ear effusion. There is no impacted cerumen. Tympanic membrane is not erythematous or bulging.     Left Ear: Tympanic  membrane, ear canal and external ear normal. No drainage.  No middle ear effusion. There is no impacted cerumen. Tympanic membrane is not erythematous or bulging.     Nose: Nose normal. No nasal deformity, septal deviation, mucosal edema, congestion or rhinorrhea.     Right Turbinates: Not enlarged, swollen or pale.     Left Turbinates: Not enlarged, swollen or pale.     Right Sinus: No maxillary sinus tenderness or frontal sinus tenderness.     Left Sinus: No maxillary sinus tenderness or frontal sinus tenderness.     Mouth/Throat:     Lips: Pink. No lesions.     Mouth: Mucous membranes are moist. No oral lesions.     Pharynx: Oropharynx is clear. Uvula midline. No posterior oropharyngeal erythema or uvula swelling.     Tonsils: No tonsillar exudate. 0 on the right. 0 on the left.  Eyes:     General: Lids are normal.        Right eye: No discharge.        Left eye: No discharge.     Extraocular Movements: Extraocular movements intact.     Conjunctiva/sclera: Conjunctivae normal.      Right eye: Right conjunctiva is not injected.     Left eye: Left conjunctiva is not injected.  Neck:     Trachea: Trachea and phonation normal.  Cardiovascular:     Rate and Rhythm: Normal rate and regular rhythm.     Pulses: Normal pulses.     Heart sounds: Normal heart sounds. No murmur heard.   No friction rub. No gallop.  Pulmonary:     Effort: Pulmonary effort is normal. No accessory muscle usage, prolonged expiration or respiratory distress.     Breath sounds: Normal breath sounds. No stridor, decreased air movement or transmitted upper airway sounds. No decreased breath sounds, wheezing, rhonchi or rales.  Chest:     Chest wall: No tenderness.  Abdominal:     General: Bowel sounds are decreased.     Tenderness: There is no abdominal tenderness.     Comments: Patient has an easily palpable aortic pulse  Musculoskeletal:        General: Normal range of motion.     Cervical back: Normal range of motion and neck supple. Normal range of motion.  Lymphadenopathy:     Cervical: No cervical adenopathy.  Skin:    General: Skin is warm and dry.     Findings: No erythema or rash.  Neurological:     General: No focal deficit present.     Mental Status: He is alert and oriented to person, place, and time.  Psychiatric:        Mood and Affect: Mood normal.        Behavior: Behavior normal.    Visual Acuity Right Eye Distance:   Left Eye Distance:   Bilateral Distance:    Right Eye Near:   Left Eye Near:    Bilateral Near:     UC Couse / Diagnostics / Procedures:    EKG  Radiology No results found.  Procedures Procedures (including critical care time)  UC Diagnoses / Final Clinical Impressions(s)   I have reviewed the triage vital signs and the nursing notes.  Pertinent labs & imaging results that were available during my care of the patient were reviewed by me and considered in my medical decision making (see chart for details).    Final diagnoses:  Generalized  abdominal pain  Constipation, unspecified constipation type  Patient reassured exam findings are normal.  Patient advised to begin fiber regimen to improve bowel habits.  ED Prescriptions     Medication Sig Dispense Auth. Provider   Psyllium 400 MG CAPS Take 1 capsule by mouth in the morning and at bedtime. 180 capsule Theadora Rama Scales, PA-C      PDMP not reviewed this encounter.  Pending results:  Labs Reviewed - No data to display  Medications Ordered in UC: Medications - No data to display  Disposition Upon Discharge:  Condition: stable for discharge home Home: take medications as prescribed; routine discharge instructions as discussed; follow up as advised.  Patient presented with an acute illness with associated systemic symptoms and significant discomfort requiring urgent management. In my opinion, this is a condition that a prudent lay person (someone who possesses an average knowledge of health and medicine) may potentially expect to result in complications if not addressed urgently such as respiratory distress, impairment of bodily function or dysfunction of bodily organs.   Routine symptom specific, illness specific and/or disease specific instructions were discussed with the patient and/or caregiver at length.   As such, the patient has been evaluated and assessed, work-up was performed and treatment was provided in alignment with urgent care protocols and evidence based medicine.  Patient/parent/caregiver has been advised that the patient may require follow up for further testing and treatment if the symptoms continue in spite of treatment, as clinically indicated and appropriate.  If the patient was tested for COVID-19, Influenza and/or RSV, then the patient/parent/guardian was advised to isolate at home pending the results of his/her diagnostic coronavirus test and potentially longer if theyre positive. I have also advised pt that if his/her COVID-19 test returns  positive, it's recommended to self-isolate for at least 10 days after symptoms first appeared AND until fever-free for 24 hours without fever reducer AND other symptoms have improved or resolved. Discussed self-isolation recommendations as well as instructions for household member/close contacts as per the [facility] and [place] [facility], and also gave patient the COVID packet with this information.  Patient/parent/caregiver has been advised to return to the [facility] or PCP in 3-5 days if no better; to PCP or the Emergency Department if new signs and symptoms develop, or if the current signs or symptoms continue to change or worsen for further workup, evaluation and treatment as clinically indicated and appropriate  The patient will follow up with their current PCP if and as advised. If the patient does not currently have a PCP we will assist them in obtaining one.   The patient may need specialty follow up if the symptoms continue, in spite of conservative treatment and management, for further workup, evaluation, consultation and treatment as clinically indicated and appropriate.   Patient/parent/caregiver verbalized understanding and agreement of plan as discussed.  All questions were addressed during visit.  Please see discharge instructions below for further details of plan.  Discharge Instructions:   Discharge Instructions      The pulsating sensation you feel your abdomen is completely normal, this is the normal pulsation of your aorta, consider yourself lucky that you are thin enough to feel it.  I believe that you may be suffering from some chronic, undetected constipation which is causing you some generalized abdominal pain that appears in different locations at different times.  For this, I recommend that you ensure that you are drinking at least 80 ounces of water every day and that you begin taking psyllium husk capsule twice daily with your morning  and evening meals.  Please discuss these findings and  your experience with the psyllium husk capsules with your doctor next Thursday.  Thank you for visiting urgent care.      This office note has been dictated using Museum/gallery curator.  Unfortunately, and despite my best efforts, this method of dictation can sometimes lead to occasional typographical or grammatical errors.  I apologize in advance if this occurs.     Lynden Oxford Scales, [place] 08/26/21 [phone]

## 2021-08-23 NOTE — Discharge Instructions (Addendum)
The pulsating sensation you feel your abdomen is completely normal, this is the normal pulsation of your aorta, consider yourself lucky that you are thin enough to feel it.  I believe that you may be suffering from some chronic, undetected constipation which is causing you some generalized abdominal pain that appears in different locations at different times.  For this, I recommend that you ensure that you are drinking at least 80 ounces of water every day and that you begin taking psyllium husk capsule twice daily with your morning and evening meals.  Please discuss these findings and your experience with the psyllium husk capsules with your doctor next Thursday.  Thank you for visiting urgent care.

## 2021-08-23 NOTE — ED Triage Notes (Signed)
Patient c/o generalized abd pain that has been going on for 4-5 days. Patient states while lying flat he feels a pulse in his abd. Pt denies N/V/D and he reports having an increase in thirst.

## 2021-08-29 ENCOUNTER — Encounter: Payer: BC Managed Care – PPO | Admitting: Internal Medicine

## 2022-09-24 IMAGING — US US ABDOMEN LIMITED
1 series · 14 of 25 positions shown · non-contrast
Comparison: None.

CLINICAL DATA: Elevated liver enzymes.

EXAM:
ULTRASOUND ABDOMEN LIMITED RIGHT UPPER QUADRANT

[Series 1: us abdomen limited ruq (liver/gb) · 14 of 47 slices shown]
[im 1/47]
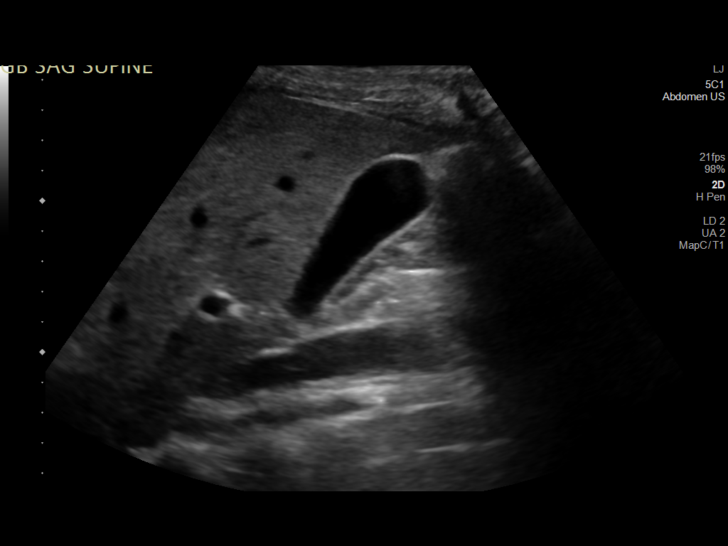
[im 4/47]
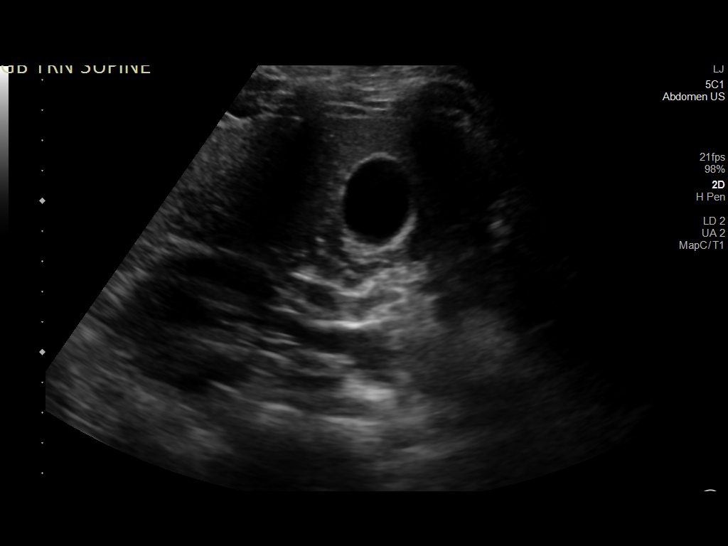
[im 8/47]
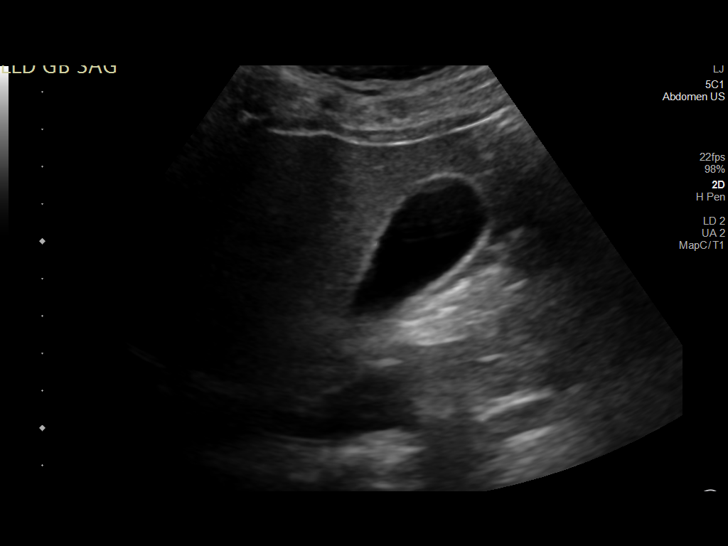
[im 12/47]
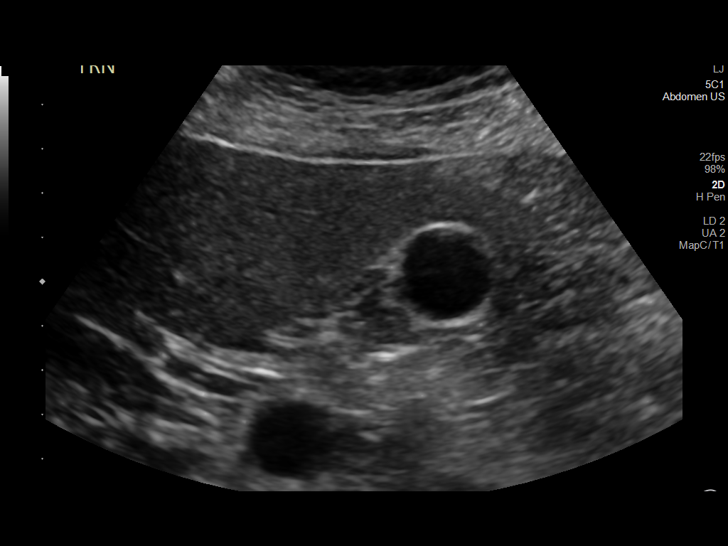
[im 16/47]
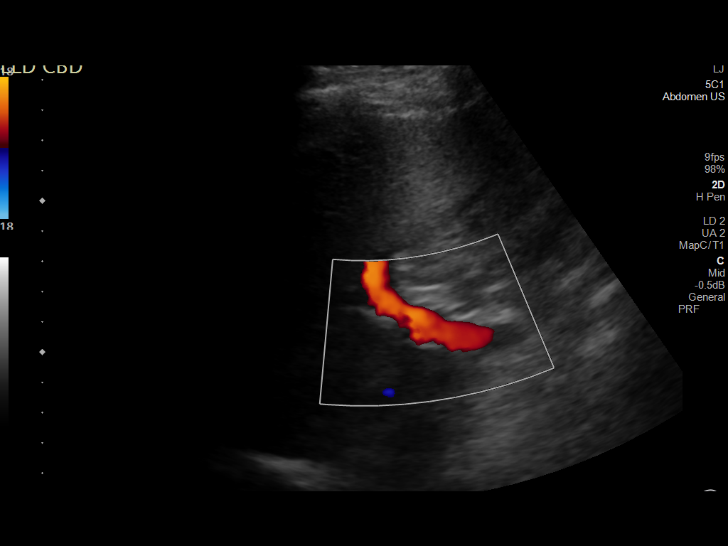
[im 18/47]
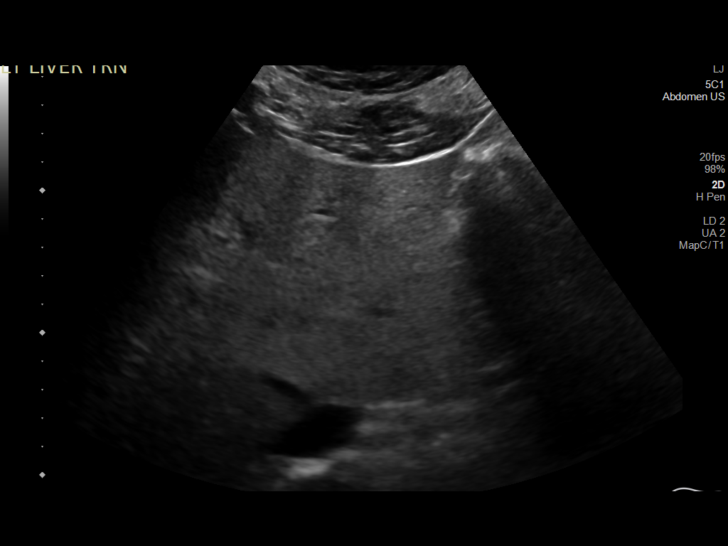
[im 22/47]
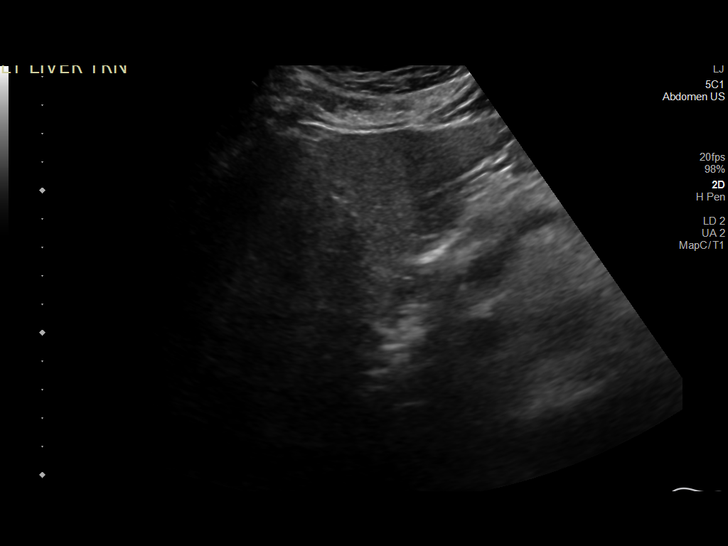
[im 25/47]
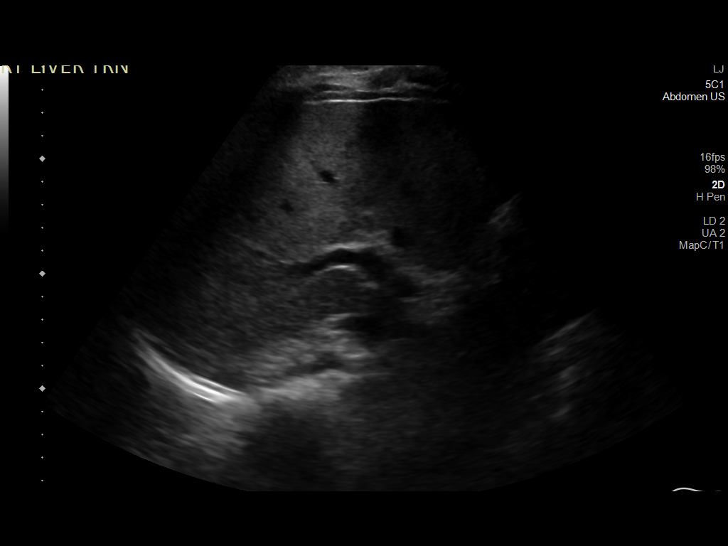
[im 29/47]
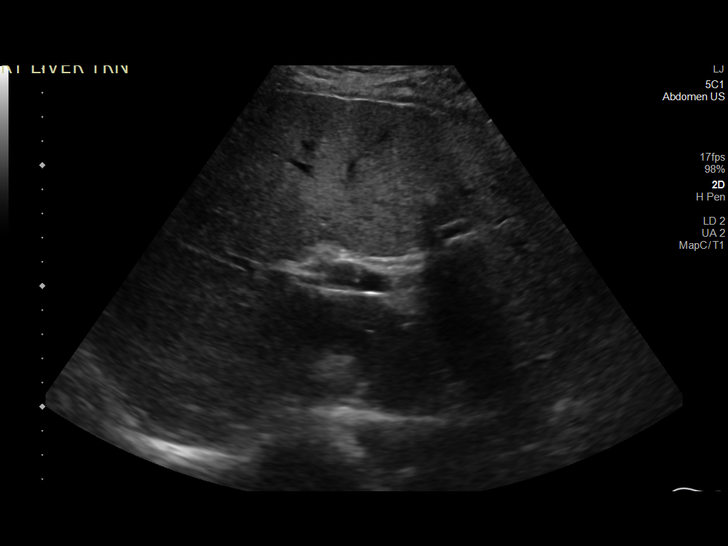
[im 31/47]
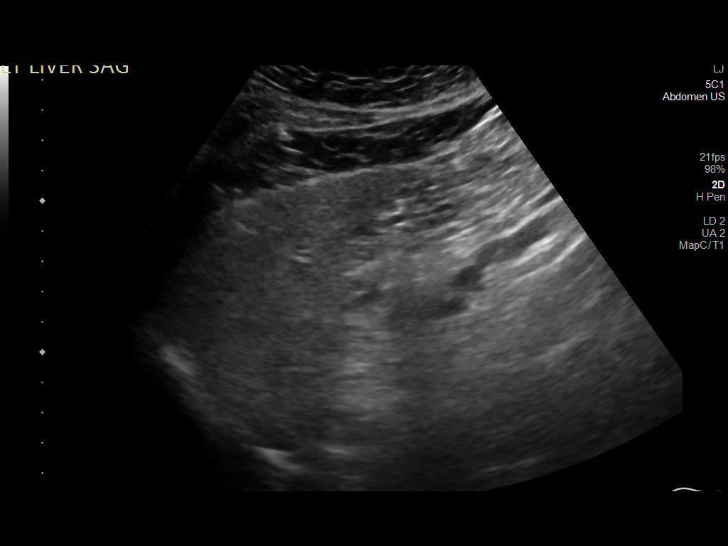
[im 35/47]
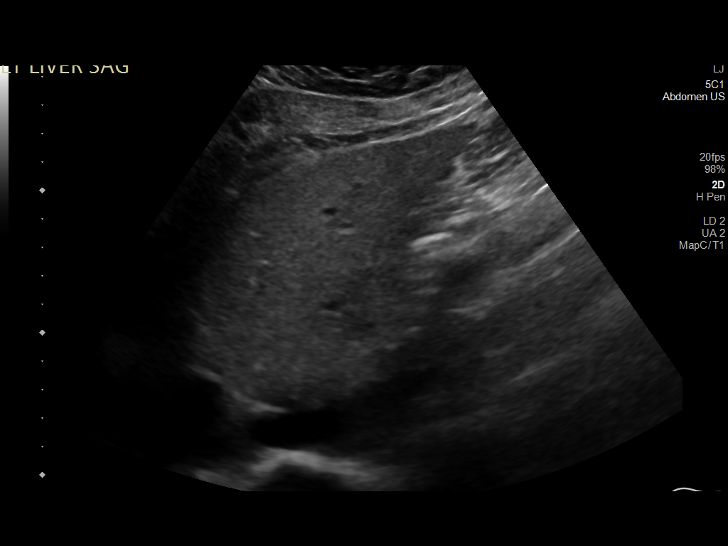
[im 39/47]
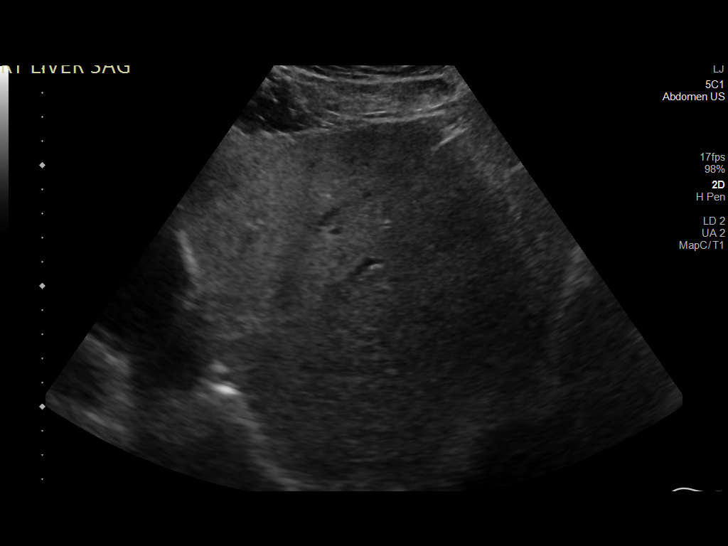
[im 43/47]
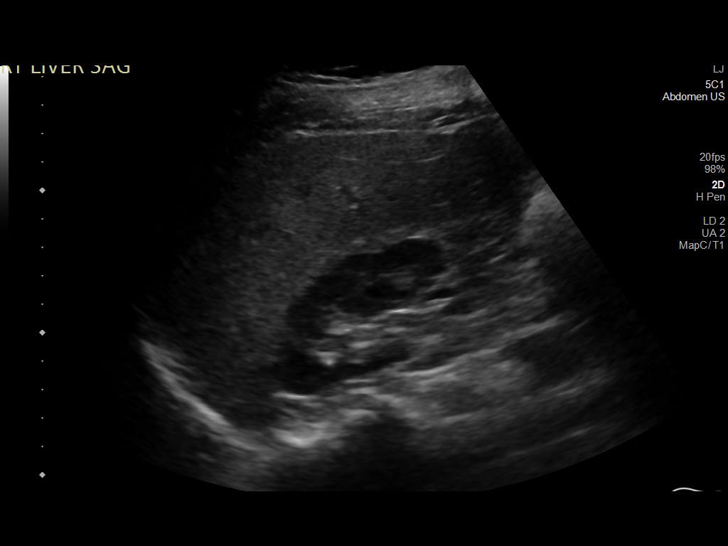
[im 47/47]
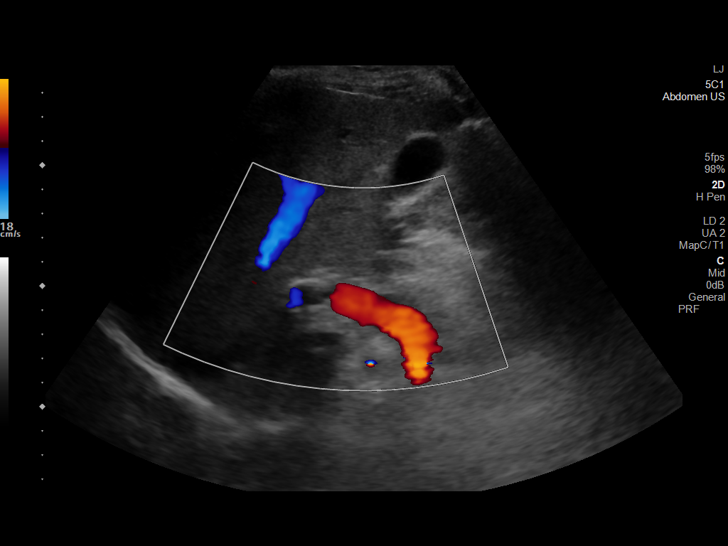

[14 of 25 positions shown; findings below may reference images not displayed]

FINDINGS: Gallbladder:

No gallstones or wall thickening visualized. No sonographic Murphy
sign noted by sonographer.

Common bile duct:

Diameter: 2 mm

Liver:

The liver demonstrates coarse echotexture and increased
echogenicity, likely reflecting diffuse steatosis. No overt
cirrhotic contour abnormalities or focal lesions are identified.
There is no evidence of intrahepatic biliary ductal dilatation.
Portal vein is patent on color Doppler imaging with normal direction
of blood flow towards the liver.

Other: None.
IMPRESSION: Increased echogenicity of the liver parenchyma likely reflects
underlying steatosis.
